# Patient Record
Sex: Male | Born: 1990 | Hispanic: Yes | Marital: Single | Smoking: Never smoker
Health system: Southern US, Community
[De-identification: ages and names within clinical notes are randomized; demographics above are authoritative.]

---

## 2018-03-09 ENCOUNTER — Encounter: Payer: Self-pay | Admitting: Emergency Medicine

## 2018-03-09 ENCOUNTER — Emergency Department: Payer: BLUE CROSS/BLUE SHIELD

## 2018-03-09 ENCOUNTER — Emergency Department
Admission: EM | Admit: 2018-03-09 | Discharge: 2018-03-09 | Disposition: A | Discharge status: Home / Self Care | Payer: BLUE CROSS/BLUE SHIELD | Attending: Emergency Medicine | Admitting: Emergency Medicine

## 2018-03-09 DIAGNOSIS — Y9289 Other specified places as the place of occurrence of the external cause: Secondary | ICD-10-CM | POA: Diagnosis not present

## 2018-03-09 DIAGNOSIS — S59911A Unspecified injury of right forearm, initial encounter: Secondary | ICD-10-CM | POA: Diagnosis present

## 2018-03-09 DIAGNOSIS — S51811A Laceration without foreign body of right forearm, initial encounter: Secondary | ICD-10-CM | POA: Insufficient documentation

## 2018-03-09 DIAGNOSIS — Z23 Encounter for immunization: Secondary | ICD-10-CM | POA: Insufficient documentation

## 2018-03-09 DIAGNOSIS — W25XXXA Contact with sharp glass, initial encounter: Secondary | ICD-10-CM | POA: Diagnosis not present

## 2018-03-09 DIAGNOSIS — Y9389 Activity, other specified: Secondary | ICD-10-CM | POA: Insufficient documentation

## 2018-03-09 DIAGNOSIS — Y999 Unspecified external cause status: Secondary | ICD-10-CM | POA: Diagnosis not present

## 2018-03-09 MED ORDER — LIDOCAINE HCL (PF) 1 % IJ SOLN
5.0000 mL | Freq: Once | INTRAMUSCULAR | Status: AC
  Administered 2018-03-09: 5 mL
  Filled 2018-03-09: qty 5

## 2018-03-09 MED ORDER — TETANUS-DIPHTH-ACELL PERTUSSIS 5-2.5-18.5 LF-MCG/0.5 IM SUSP
0.5000 mL | Freq: Once | INTRAMUSCULAR | Status: AC
  Administered 2018-03-09: 0.5 mL via INTRAMUSCULAR
  Filled 2018-03-09: qty 0.5

## 2018-03-09 NOTE — ED Triage Notes (Signed)
Patient presents to the ED with laceration to his right forearm.  Patient states he was knocking on a window to try to get his dog to stop barking and the window shattered and cut his arm.  Patient states, "the windows are way too thin."

## 2018-03-09 NOTE — Discharge Instructions (Signed)
Keep the wound clean, dry, and covered. Wash only with soap & water. See a local urgent care center for suture removal.

## 2018-03-09 NOTE — ED Provider Notes (Signed)
[facility] Emergency Department Provider Note ____________________________________________  Time seen: 1749  I have reviewed the triage vital signs and the nursing notes.  HISTORY  Chief Complaint  Laceration  HPI Matthew Rodgers is a 27 y.o. male sent to the ED for evaluation of accidental laceration to his left forearm.  Patient admits to banking on a glass window to get his dog's attention, when the glass broke.  He describes him going through, causing a laceration to the volar aspect of his wrist.  He denies any other injury at this time.  He reports normal sensation and strength distally.  He notes his last tetanus was at least 9 years prior.  History reviewed. No pertinent past medical history.  There are no active problems to display for this patient.  History reviewed. No pertinent surgical history.  Prior to Admission medications   Not on File    Allergies Patient has no known allergies.  No family history on file.  Social History Social History   Tobacco Use  . Smoking status: Never Smoker  . Smokeless tobacco: Never Used  Substance Use Topics  . Alcohol use: Never    Frequency: Never  . Drug use: Not on file    Review of Systems  Constitutional: Negative for fever. Cardiovascular: Negative for chest pain. Respiratory: Negative for shortness of breath. Musculoskeletal: Negative for back pain. Skin: Negative for rash.  Right forearm laceration as above. Neurological: Negative for headaches, focal weakness or numbness. ____________________________________________  PHYSICAL EXAM:  VITAL SIGNS: ED Triage Vitals  Enc Vitals Group     BP 03/09/18 1656 118/70     Pulse Rate 03/09/18 1656 82     Resp 03/09/18 1656 16     Temp 03/09/18 1656 99.1 F (37.3 C)     Temp Source 03/09/18 1656 Oral     SpO2 03/09/18 1656 100 %     Weight 03/09/18 1652 110 lb (49.9 kg)     Height 03/09/18 1652 5\' 7"  (1.702 m)     Head Circumference --       Peak Flow --      Pain Score 03/09/18 1652 7     Pain Loc --      Pain Edu? --      Excl. in GC? --     Constitutional: Alert and oriented. Well appearing and in no distress. Head: Normocephalic and atraumatic. Cardiovascular: Normal rate, regular rhythm. Normal distal pulses. Respiratory: Normal respiratory effort. No wheezes/rales/rhonchi. Musculoskeletal: Normal composite fist on the right hand.  Nontender with normal range of motion in all extremities.  Neurologic:  Normal gross sensation.  Intrinsic and opposition testing.  Normal speech and language. No gross focal neurologic deficits are appreciated. Skin:  Skin is warm, dry and intact. No rash noted.  Right volar forearm with a 3 cm laceration noted.  The laceration extends through to the sub-cutaneous tissues.  No tendon injury is appreciated. ____________________________________________  PROCEDURES Tdap 0.5 ml IM  .Marland KitchenLaceration Repair Date/Time: 03/09/2018 5:52 PM Performed by: Lissa Hoard, PA-C Authorized by: Lissa Hoard, PA-C   Consent:    Consent obtained:  Verbal   Consent given by:  Patient   Risks discussed:  Poor wound healing Anesthesia (see MAR for exact dosages):    Anesthesia method:  Local infiltration   Local anesthetic:  Lidocaine 1% w/o epi Laceration details:    Location:  Shoulder/arm   Shoulder/arm location:  R lower arm   Length (cm):  3 Repair type:    Repair type:  Simple Treatment:    Area cleansed with:  Betadine   Amount of cleaning:  Standard Skin repair:    Repair method:  Sutures   Suture size:  3-0   Suture material:  Nylon   Suture technique:  Simple interrupted   Number of sutures:  7 Approximation:    Approximation:  Close Post-procedure details:    Dressing:  Non-adherent dressing   Patient tolerance of procedure:  Tolerated well, no immediate complications  __________________________________________  INITIAL IMPRESSION / ASSESSMENT AND PLAN /  ED COURSE  Patient with a ED evaluation of accidental laceration to the right forearm.  Patient without any indication of any neuromuscular deficit tolerates the wound laceration repair without difficulty.  Wound care instructions are provided.  ____________________________________________  FINAL CLINICAL IMPRESSION(S) / ED DIAGNOSES  Final diagnoses:  Laceration of right forearm, initial encounter      Lissa HoardMenshew, Maclane Holloran V Bacon, PA-C 03/09/18 1827    Sharyn CreamerQuale, Mark, MD 03/09/18 [phone]

## 2019-06-05 IMAGING — CR DG FOREARM 2V*R*
1 series · 2 of 2 positions shown · non-contrast
Comparison: None.

CLINICAL DATA: Distal forearm laceration.

EXAM:
RIGHT FOREARM - 2 VIEW

[Series 1: dg forearm right · 0.14mm/px · 2 of 2 slices shown]
[im 1/2]
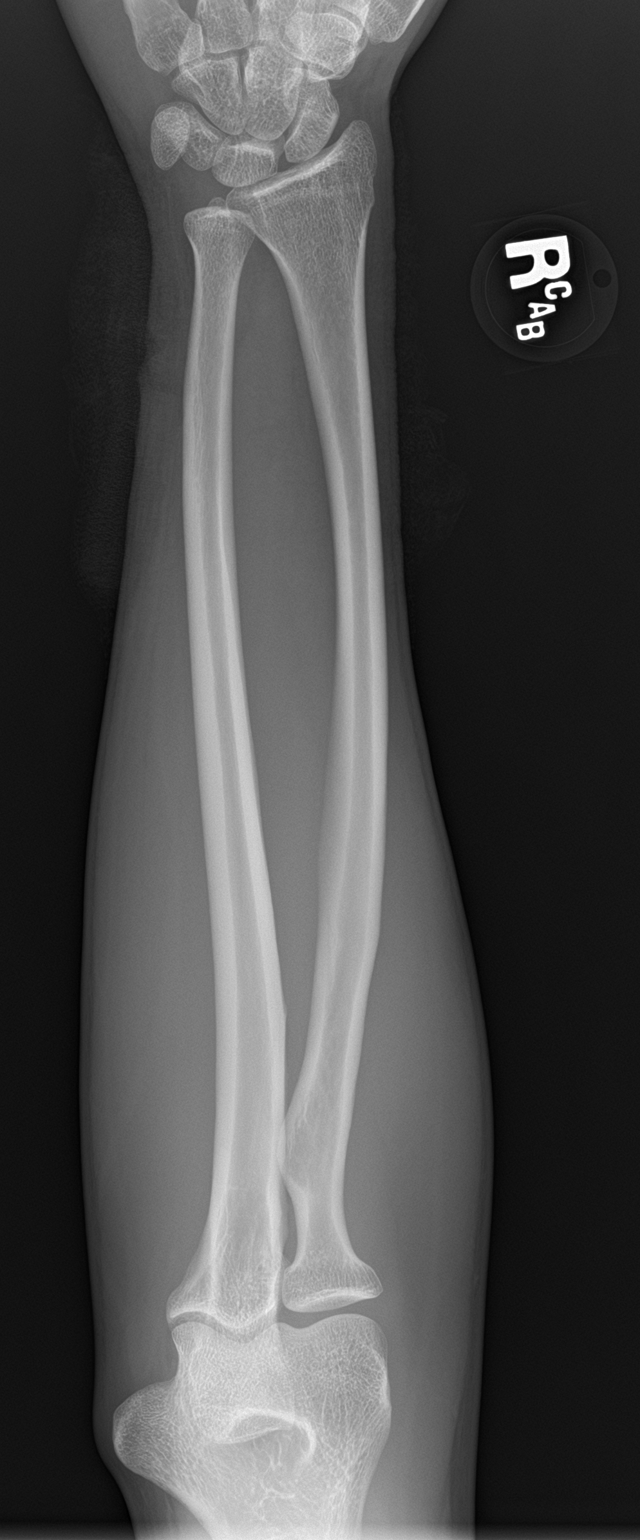
[im 2/2]
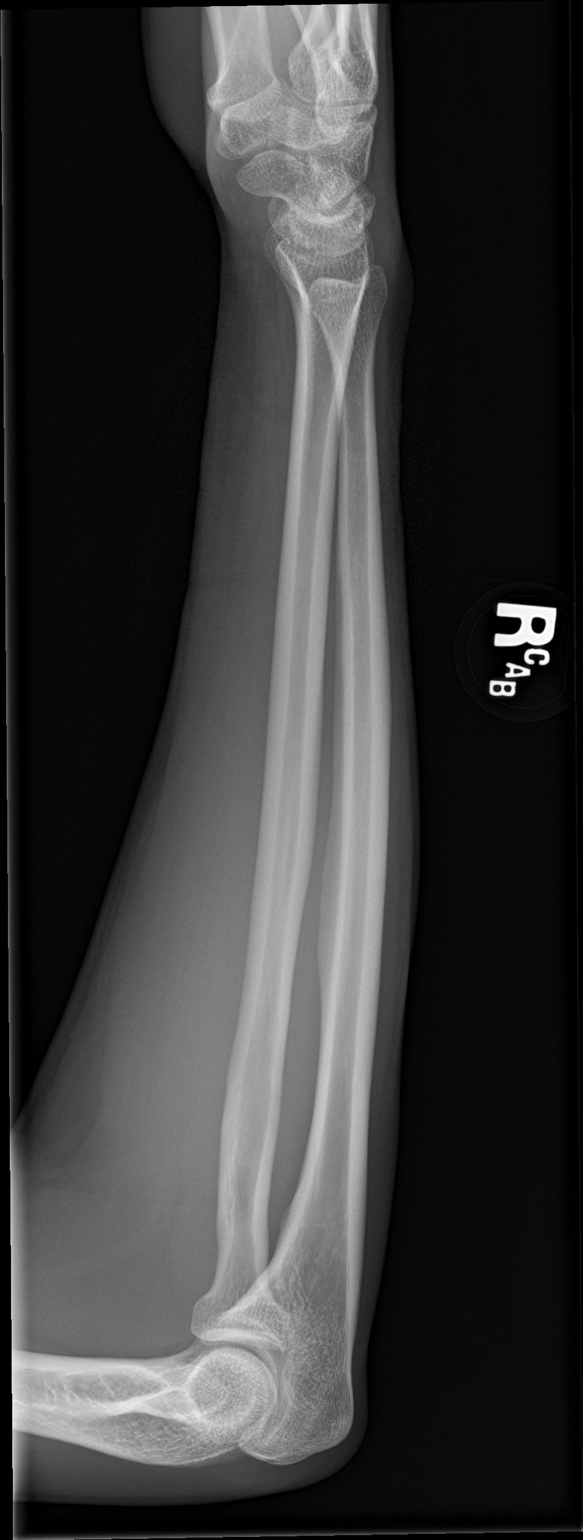

[2 of 2 positions shown; findings below may reference images not displayed]

FINDINGS: There is no evidence of fracture or other focal bone lesions. Small
laceration along the ulnar aspect of the distal forearm. No
radiopaque foreign body..
IMPRESSION: Soft tissue laceration along the ulnar aspect of the distal forearm.
No radiopaque foreign body or acute osseous abnormality.
